# Patient Record
Sex: Female | Born: 1985 | Race: Black or African American | Hispanic: No | Marital: Married | Smoking: Never smoker
Health system: Southern US, Community
[De-identification: ages and names within clinical notes are randomized; demographics above are authoritative.]

## PROBLEM LIST (undated history)

## (undated) ENCOUNTER — Inpatient Hospital Stay (HOSPITAL_COMMUNITY): Payer: Self-pay

## (undated) DIAGNOSIS — Z789 Other specified health status: Secondary | ICD-10-CM

## (undated) HISTORY — PX: NO PAST SURGERIES: SHX2092

## (undated) HISTORY — DX: Other specified health status: Z78.9

---

## 2014-12-08 ENCOUNTER — Encounter: Payer: Self-pay | Admitting: Family Medicine

## 2014-12-25 ENCOUNTER — Encounter: Payer: Self-pay | Admitting: Family Medicine

## 2015-03-30 ENCOUNTER — Other Ambulatory Visit: Payer: Self-pay | Admitting: Student

## 2015-03-30 ENCOUNTER — Ambulatory Visit (HOSPITAL_COMMUNITY)
Admission: RE | Admit: 2015-03-30 | Discharge: 2015-03-30 | Disposition: A | Discharge status: Home / Self Care | Payer: Self-pay | Source: Ambulatory Visit | Attending: Student | Admitting: Student

## 2015-03-30 ENCOUNTER — Encounter: Payer: Self-pay | Admitting: Student

## 2015-03-30 ENCOUNTER — Ambulatory Visit (INDEPENDENT_AMBULATORY_CARE_PROVIDER_SITE_OTHER): Payer: Self-pay | Admitting: Student

## 2015-03-30 VITALS — BP 116/58 | HR 82 | Ht 66.14 in | Wt 164.3 lb

## 2015-03-30 DIAGNOSIS — Z3689 Encounter for other specified antenatal screening: Secondary | ICD-10-CM

## 2015-03-30 DIAGNOSIS — Z3A31 31 weeks gestation of pregnancy: Secondary | ICD-10-CM | POA: Insufficient documentation

## 2015-03-30 DIAGNOSIS — O3410 Maternal care for benign tumor of corpus uteri, unspecified trimester: Secondary | ICD-10-CM

## 2015-03-30 DIAGNOSIS — O3411 Maternal care for benign tumor of corpus uteri, first trimester: Secondary | ICD-10-CM

## 2015-03-30 DIAGNOSIS — D259 Leiomyoma of uterus, unspecified: Secondary | ICD-10-CM

## 2015-03-30 DIAGNOSIS — Z124 Encounter for screening for malignant neoplasm of cervix: Secondary | ICD-10-CM

## 2015-03-30 DIAGNOSIS — Z36 Encounter for antenatal screening of mother: Secondary | ICD-10-CM | POA: Insufficient documentation

## 2015-03-30 DIAGNOSIS — Z3481 Encounter for supervision of other normal pregnancy, first trimester: Secondary | ICD-10-CM

## 2015-03-30 DIAGNOSIS — Z3491 Encounter for supervision of normal pregnancy, unspecified, first trimester: Secondary | ICD-10-CM

## 2015-03-30 DIAGNOSIS — O3413 Maternal care for benign tumor of corpus uteri, third trimester: Secondary | ICD-10-CM | POA: Insufficient documentation

## 2015-03-30 DIAGNOSIS — Z113 Encounter for screening for infections with a predominantly sexual mode of transmission: Secondary | ICD-10-CM

## 2015-03-30 DIAGNOSIS — O0990 Supervision of high risk pregnancy, unspecified, unspecified trimester: Secondary | ICD-10-CM | POA: Insufficient documentation

## 2015-03-30 LAB — POCT URINALYSIS DIP (DEVICE)
Bilirubin Urine: NEGATIVE
Glucose, UA: NEGATIVE mg/dL
Ketones, ur: NEGATIVE mg/dL
Nitrite: NEGATIVE
Protein, ur: NEGATIVE mg/dL
Specific Gravity, Urine: 1.015 (ref 1.005–1.030)
Urobilinogen, UA: 0.2 mg/dL (ref 0.0–1.0)
pH: 7 (ref 5.0–8.0)

## 2015-03-30 NOTE — Progress Notes (Signed)
  Subjective:    Christine Myers is being seen today for her first obstetrical visit.  This is a planned pregnancy. She is at [redacted]w[redacted]d gestation. Her obstetrical history is significant for uterine fibroids & late transfer of care from [place]. Relationship with FOB: spouse, living together. Patient does intend to breast feed. Pregnancy history fully reviewed.  Patient reports no complaints.  Review of Systems:   Review of Systems Review of Systems - History obtained from the patient General ROS: negative Gastrointestinal ROS: negative Genito-Urinary ROS: positive for - genital discharge. Reports vaginal discharge since beginning of pregnancy. Evaluated by ob in [place] & told normal. No change.  negative for - vaginal bleeding or irritation  Objective:     BP 116/58 mmHg  Pulse 82  Ht 5' 6.14" (1.68 m)  Wt 164 lb 4.8 oz (74.526 kg)  BMI 26.41 kg/m2  LMP 08/25/2014 (Exact Date) Physical Exam  Exam Physical Examination: General appearance - alert, well appearing, and in no distress and oriented to person, place, and time Neck - supple, no significant adenopathy Heart - normal rate, regular rhythm, normal S1, S2, no murmurs, rubs, clicks or gallops Abdomen - soft, non tender. BS present. Fundal height 28 cm Pelvic - VULVA: normal appearing vulva with no masses, tenderness or lesions, VAGINA: vaginal discharge - white, creamy, odorless and moderate amount, WET MOUNT collected, CERVIX: visually closed  Musculoskeletal - full range of motion without pain Skin - normal coloration and turgor, no rashes, no suspicious skin lesions noted    Assessment:    Pregnancy: G1P0 Patient Active Problem List   Diagnosis Date Noted  . Supervision of normal pregnancy in first trimester 03/30/2015  . 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 Supervision of normal pregnancy in first trimester  - Prenatal Profile - Glucose Tolerance, 1 HR (50g) w/o Fasting -  Hemoglobinopathy evaluation - Culture, OB Urine - Prescript Monitor Profile(19) - GC/Chlamydia probe amp ([place])not at [facility] - [place] MFM OB COMP + 14 WK; Future - Cytology - PAP - Wet prep, genital - Flu Vaccine QUAD 36+ mos IM (Fluarix, Quad PF)  2. 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 Vaginal discharge  -wet prep sent    Initial labs drawn. Prenatal vitamins. Problem list reviewed and updated.    Jorje Guild 03/30/2015

## 2015-03-30 NOTE — Progress Notes (Signed)
Pt reports already have taken the tdap and flu vaccines. Pt arrived in [place] two days ago.

## 2015-03-30 NOTE — Patient Instructions (Signed)
Preterm Labor Information Preterm labor is when labor starts at less than 37 weeks of pregnancy. The normal length of a pregnancy is 39 to 41 weeks. CAUSES Often, there is no identifiable underlying cause as to why a woman goes into preterm labor. One of the most common known causes of preterm labor is infection. Infections of the uterus, cervix, vagina, amniotic sac, bladder, kidney, or even the lungs (pneumonia) can cause labor to start. Other suspected causes of preterm labor include:   Urogenital infections, such as yeast infections and bacterial vaginosis.   Uterine abnormalities (uterine shape, uterine septum, fibroids, or bleeding from the placenta).   A cervix that has been operated on (it may fail to stay closed).   Malformations in the fetus.   Multiple gestations (twins, triplets, and so on).   Breakage of the amniotic sac.  RISK FACTORS  Having a previous history of preterm labor.   Having premature rupture of membranes (PROM).   Having a placenta that covers the opening of the cervix (placenta previa).   Having a placenta that separates from the uterus (placental abruption).   Having a cervix that is too weak to hold the fetus in the uterus (incompetent cervix).   Having too much fluid in the amniotic sac (polyhydramnios).   Taking illegal drugs or smoking while pregnant.   Not gaining enough weight while pregnant.   Being younger than 34 and older than 28 years old.   Having a low socioeconomic status.   Being African American. SYMPTOMS Signs and symptoms of preterm labor include:   Menstrual-like cramps, abdominal pain, or back pain.  Uterine contractions that are regular, as frequent as six in an hour, regardless of their intensity (may be mild or painful).  Contractions that start on the top of the uterus and spread down to the lower abdomen and back.   A sense of increased pelvic pressure.   A watery or bloody mucus discharge that  comes from the vagina.  TREATMENT Depending on the length of the pregnancy and other circumstances, your health care provider may suggest bed rest. If necessary, there are medicines that can be given to stop contractions and to mature the fetal lungs. If labor happens before 34 weeks of pregnancy, a prolonged hospital stay may be recommended. Treatment depends on the condition of both you and the fetus.  WHAT SHOULD YOU DO IF YOU THINK YOU ARE IN PRETERM LABOR? Call your health care provider right away. You will need to go to the hospital to get checked immediately. HOW CAN YOU PREVENT PRETERM LABOR IN FUTURE PREGNANCIES? You should:   Stop smoking if you smoke.  Maintain healthy weight gain and avoid chemicals and drugs that are not necessary.  Be watchful for any type of infection.  Inform your health care provider if you have a known history of preterm labor.   This information is not intended to replace advice given to you by your health care provider. Make sure you discuss any questions you have with your health care provider.   Document Released: 04/29/2003 Document Revised: 10/09/2012 Document Reviewed: 03/11/2012 Elsevier Interactive Patient Education 2016 [place]. Fetal Movement Counts Patient Name: __________________________________________________ Patient Due Date: ____________________ Performing a fetal movement count is highly recommended in high-risk pregnancies, but it is good for every pregnant woman to do. Your health care provider may ask you to start counting fetal movements at 28 weeks of the pregnancy. Fetal movements often increase:  After eating a full meal.  After physical activity.  After eating or drinking something sweet or cold.  At rest. Pay attention to when you feel the baby is most active. This will help you notice a pattern of your baby's sleep and wake cycles and what factors contribute to an increase in fetal movement. It is important to  perform a fetal movement count at the same time each day when your baby is normally most active.  HOW TO COUNT FETAL MOVEMENTS  Find a quiet and comfortable area to sit or lie down on your left side. Lying on your left side provides the best blood and oxygen circulation to your baby.  Write down the day and time on a sheet of paper or in a journal.  Start counting kicks, flutters, swishes, rolls, or jabs in a 2-hour period. You should feel at least 10 movements within 2 hours.  If you do not feel 10 movements in 2 hours, wait 2-3 hours and count again. Look for a change in the pattern or not enough counts in 2 hours. SEEK MEDICAL CARE IF:  You feel less than 10 counts in 2 hours, tried twice.  There is no movement in over an hour.  The pattern is changing or taking longer each day to reach 10 counts in 2 hours.  You feel the baby is not moving as he or she usually does. Date: ____________ Movements: ____________ Start time: ____________ Christine Myers time: ____________  Date: ____________ Movements: ____________ Start time: ____________ Christine Myers time: ____________ Date: ____________ Movements: ____________ Start time: ____________ Christine Myers time: ____________ Date: ____________ Movements: ____________ Start time: ____________ Christine Myers time: ____________ Date: ____________ Movements: ____________ Start time: ____________ Christine Myers time: ____________ Date: ____________ Movements: ____________ Start time: ____________ Christine Myers time: ____________ Date: ____________ Movements: ____________ Start time: ____________ Christine Myers time: ____________ Date: ____________ Movements: ____________ Start time: ____________ Christine Myers time: ____________  Date: ____________ Movements: ____________ Start time: ____________ Christine Myers time: ____________ Date: ____________ Movements: ____________ Start time: ____________ Christine Myers time: ____________ Date: ____________ Movements: ____________ Start time: ____________ Christine Myers time:  ____________ Date: ____________ Movements: ____________ Start time: ____________ Christine Myers time: ____________ Date: ____________ Movements: ____________ Start time: ____________ Christine Myers time: ____________ Date: ____________ Movements: ____________ Start time: ____________ Christine Myers time: ____________ Date: ____________ Movements: ____________ Start time: ____________ Christine Myers time: ____________  Date: ____________ Movements: ____________ Start time: ____________ Christine Myers time: ____________ Date: ____________ Movements: ____________ Start time: ____________ Christine Myers time: ____________ Date: ____________ Movements: ____________ Start time: ____________ Christine Myers time: ____________ Date: ____________ Movements: ____________ Start time: ____________ Christine Myers time: ____________ Date: ____________ Movements: ____________ Start time: ____________ Christine Myers time: ____________ Date: ____________ Movements: ____________ Start time: ____________ Christine Myers time: ____________ Date: ____________ Movements: ____________ Start time: ____________ Christine Myers time: ____________  Date: ____________ Movements: ____________ Start time: ____________ Christine Myers time: ____________ Date: ____________ Movements: ____________ Start time: ____________ Christine Myers time: ____________ Date: ____________ Movements: ____________ Start time: ____________ Christine Myers time: ____________ Date: ____________ Movements: ____________ Start time: ____________ Christine Myers time: ____________ Date: ____________ Movements: ____________ Start time: ____________ Christine Myers time: ____________ Date: ____________ Movements: ____________ Start time: ____________ Christine Myers time: ____________ Date: ____________ Movements: ____________ Start time: ____________ Christine Myers time: ____________  Date: ____________ Movements: ____________ Start time: ____________ Christine Myers time: ____________ Date: ____________ Movements: ____________ Start time: ____________ Christine Myers time: ____________ Date: ____________ Movements:  ____________ Start time: ____________ Christine Myers time: ____________ Date: ____________ Movements: ____________ Start time: ____________ Christine Myers time: ____________ Date: ____________ Movements: ____________ Start time: ____________ Christine Myers time: ____________ Date: ____________ Movements: ____________ Start time: ____________ Christine Myers time: ____________ Date: ____________ Movements: ____________ Start time: ____________  Finish time: ____________  Date: ____________ Movements: ____________ Start time: ____________ Christine Myers time: ____________ Date: ____________ Movements: ____________ Start time: ____________ Christine Myers time: ____________ Date: ____________ Movements: ____________ Start time: ____________ Christine Myers time: ____________ Date: ____________ Movements: ____________ Start time: ____________ Christine Myers time: ____________ Date: ____________ Movements: ____________ Start time: ____________ Christine Myers time: ____________ Date: ____________ Movements: ____________ Start time: ____________ Christine Myers time: ____________ Date: ____________ Movements: ____________ Start time: ____________ Christine Myers time: ____________  Date: ____________ Movements: ____________ Start time: ____________ Christine Myers time: ____________ Date: ____________ Movements: ____________ Start time: ____________ Christine Myers time: ____________ Date: ____________ Movements: ____________ Start time: ____________ Christine Myers time: ____________ Date: ____________ Movements: ____________ Start time: ____________ Christine Myers time: ____________ Date: ____________ Movements: ____________ Start time: ____________ Christine Myers time: ____________ Date: ____________ Movements: ____________ Start time: ____________ Christine Myers time: ____________ Date: ____________ Movements: ____________ Start time: ____________ Christine Myers time: ____________  Date: ____________ Movements: ____________ Start time: ____________ Christine Myers time: ____________ Date: ____________ Movements: ____________ Start time: ____________ Christine Myers  time: ____________ Date: ____________ Movements: ____________ Start time: ____________ Christine Myers time: ____________ Date: ____________ Movements: ____________ Start time: ____________ Christine Myers time: ____________ Date: ____________ Movements: ____________ Start time: ____________ Christine Myers time: ____________ Date: ____________ Movements: ____________ Start time: ____________ Christine Myers time: ____________   This information is not intended to replace advice given to you by your health care provider. Make sure you discuss any questions you have with your health care provider.   Document Released: 03/08/2006 Document Revised: 02/27/2014 Document Reviewed: 12/04/2011 Elsevier Interactive Patient Education [employer].

## 2015-03-31 LAB — GC/CHLAMYDIA PROBE AMP (~~LOC~~) NOT AT ARMC
Chlamydia: NEGATIVE
NEISSERIA GONORRHEA: NEGATIVE

## 2015-03-31 LAB — PRENATAL PROFILE (SOLSTAS)
ANTIBODY SCREEN: NEGATIVE
BASOS PCT: 0 % (ref 0–1)
Basophils Absolute: 0 10*3/uL (ref 0.0–0.1)
EOS ABS: 0.1 10*3/uL (ref 0.0–0.7)
Eosinophils Relative: 1 % (ref 0–5)
HCT: 33.7 % — ABNORMAL LOW (ref 36.0–46.0)
HEMOGLOBIN: 11 g/dL — AB (ref 12.0–15.0)
HIV 1&2 Ab, 4th Generation: NONREACTIVE
Hepatitis B Surface Ag: NEGATIVE
LYMPHS ABS: 1.6 10*3/uL (ref 0.7–4.0)
Lymphocytes Relative: 26 % (ref 12–46)
MCH: 26.4 pg (ref 26.0–34.0)
MCHC: 32.6 g/dL (ref 30.0–36.0)
MCV: 80.8 fL (ref 78.0–100.0)
MONO ABS: 0.5 10*3/uL (ref 0.1–1.0)
MONOS PCT: 8 % (ref 3–12)
Neutro Abs: 4.1 10*3/uL (ref 1.7–7.7)
Neutrophils Relative %: 65 % (ref 43–77)
PLATELETS: 139 10*3/uL — AB (ref 150–400)
RBC: 4.17 MIL/uL (ref 3.87–5.11)
RDW: 16.8 % — ABNORMAL HIGH (ref 11.5–15.5)
RH TYPE: POSITIVE
RUBELLA: 3.48 {index} — AB (ref <0.90)
WBC: 6.3 10*3/uL (ref 4.0–10.5)

## 2015-03-31 LAB — WET PREP, GENITAL
Clue Cells Wet Prep HPF POC: NONE SEEN
TRICH WET PREP: NONE SEEN

## 2015-03-31 LAB — GLUCOSE TOLERANCE, 1 HOUR (50G) W/O FASTING: GLUCOSE 1 HOUR GTT: 102 mg/dL (ref 70–140)

## 2015-03-31 LAB — CYTOLOGY - PAP

## 2015-04-01 LAB — PRESCRIPTION MONITORING PROFILE (19 PANEL)
Amphetamine/Meth: NEGATIVE ng/mL
Barbiturate Screen, Urine: NEGATIVE ng/mL
Benzodiazepine Screen, Urine: NEGATIVE ng/mL
Buprenorphine, Urine: NEGATIVE ng/mL
CARISOPRODOL, URINE: NEGATIVE ng/mL
COCAINE METABOLITES: NEGATIVE ng/mL
Cannabinoid Scrn, Ur: NEGATIVE ng/mL
Creatinine, Urine: 44.95 mg/dL (ref >20.0)
ECSTASY: NEGATIVE ng/mL
FENTANYL URINE: NEGATIVE ng/mL
MEPERIDINE UR: NEGATIVE ng/mL
METHADONE SCREEN, URINE: NEGATIVE ng/mL
Methaqualone: NEGATIVE ng/mL
NITRITES URINE, INITIAL: NEGATIVE ug/mL
OXYCODONE SCRN UR: NEGATIVE ng/mL
Opiate Screen, Urine: NEGATIVE ng/mL
PROPOXYPHENE: NEGATIVE ng/mL
Phencyclidine, Ur: NEGATIVE ng/mL
TRAMADOL UR: NEGATIVE ng/mL
Tapentadol, urine: NEGATIVE ng/mL
Zolpidem, Urine: NEGATIVE ng/mL
pH, Initial: 7.9 pH (ref 4.5–8.9)

## 2015-04-01 LAB — HEMOGLOBINOPATHY EVALUATION
HGB A2 QUANT: 3.1 % (ref 2.2–3.2)
HGB A: 64.4 % — AB (ref 96.8–97.8)
HGB F QUANT: 0 % (ref 0.0–2.0)
HGB S QUANTITAION: 32.5 % — AB
Hemoglobin Other: 0 %

## 2015-04-01 LAB — CULTURE, OB URINE: Colony Count: 30000

## 2015-04-02 ENCOUNTER — Telehealth: Payer: Self-pay | Admitting: *Deleted

## 2015-04-02 ENCOUNTER — Other Ambulatory Visit: Payer: Self-pay | Admitting: Student

## 2015-04-02 DIAGNOSIS — O4103X1 Oligohydramnios, third trimester, fetus 1: Secondary | ICD-10-CM

## 2015-04-02 NOTE — Telephone Encounter (Signed)
-----   Message from Jorje Guild, NP sent at 04/02/2015 10:32 AM EST ----- Please call patient: 1. Verify pharmacy. Has yeast infection & can't send in rx d/t no pharmacy listed. Order terazol 3 for patient.  2. Pt has sickle cell trait. Please verify if husband knows status, if not, refer him to be tested.  3. Please schedule 2 f/u ultrasounds. Needs f/u in 1 week to assess AFI. Needs f/u in 4 weeks for growth & to reassess myomas.   Thanks!

## 2015-04-02 NOTE — Telephone Encounter (Signed)
Attempted to call patient to verify pharmacy and give test results/referral. Patient did not answer, voice mail left stating she should return our call at the clinic on Monday.

## 2015-04-06 NOTE — Telephone Encounter (Signed)
Called pt @ # listed and this tel # belongs to her friend. He answered and stated that he is at work. He will give pt a message to call us back.  Pt needs to be informed of several things: Ultrasound needed this week to re-check AFI - scheduled 2/17 @ 1300.  Pt also needs ultrasound for growth on 3/7 @ 0830. Pt needs Rx sent to pharmacy for +vaginal yeast - verify pharmacy. Inform pt of +sickle cell trait and verify if husband knows his sickle cell status - if not, needs referral for testing.

## 2015-04-07 NOTE — Telephone Encounter (Signed)
Attempted to reach patient at number listed in chart. No answer or voicemail to leave a message.

## 2015-04-09 ENCOUNTER — Ambulatory Visit (HOSPITAL_COMMUNITY): Admission: RE | Admit: 2015-04-09 | Payer: Self-pay | Source: Ambulatory Visit

## 2015-04-13 ENCOUNTER — Other Ambulatory Visit: Payer: Self-pay

## 2015-04-13 ENCOUNTER — Ambulatory Visit (INDEPENDENT_AMBULATORY_CARE_PROVIDER_SITE_OTHER): Payer: Self-pay | Admitting: Family

## 2015-04-13 VITALS — BP 127/77 | HR 66 | Temp 98.2°F | Wt 165.7 lb

## 2015-04-13 DIAGNOSIS — Z3481 Encounter for supervision of other normal pregnancy, first trimester: Secondary | ICD-10-CM

## 2015-04-13 DIAGNOSIS — D259 Leiomyoma of uterus, unspecified: Secondary | ICD-10-CM

## 2015-04-13 DIAGNOSIS — O289 Unspecified abnormal findings on antenatal screening of mother: Secondary | ICD-10-CM

## 2015-04-13 DIAGNOSIS — Z3491 Encounter for supervision of normal pregnancy, unspecified, first trimester: Secondary | ICD-10-CM

## 2015-04-13 DIAGNOSIS — O3410 Maternal care for benign tumor of corpus uteri, unspecified trimester: Secondary | ICD-10-CM

## 2015-04-13 DIAGNOSIS — Z23 Encounter for immunization: Secondary | ICD-10-CM

## 2015-04-13 DIAGNOSIS — O288 Other abnormal findings on antenatal screening of mother: Secondary | ICD-10-CM

## 2015-04-13 DIAGNOSIS — O3411 Maternal care for benign tumor of corpus uteri, first trimester: Secondary | ICD-10-CM

## 2015-04-13 LAB — POCT URINALYSIS DIP (DEVICE)
BILIRUBIN URINE: NEGATIVE
Glucose, UA: NEGATIVE mg/dL
HGB URINE DIPSTICK: NEGATIVE
Ketones, ur: NEGATIVE mg/dL
NITRITE: NEGATIVE
PH: 5 (ref 5.0–8.0)
Protein, ur: NEGATIVE mg/dL
Specific Gravity, Urine: 1.015 (ref 1.005–1.030)
Urobilinogen, UA: 0.2 mg/dL (ref 0.0–1.0)

## 2015-04-13 MED ORDER — PRENATAL VITAMINS 28-0.8 MG PO TABS: 1.0000 | ORAL_TABLET | Freq: Every morning | ORAL | Status: AC

## 2015-04-13 NOTE — Telephone Encounter (Signed)
Patient in office today, AFI scheduled for 2/23 @ 2pm, patient is aware. Janann Colonel, CNM discussed +sickle cell trait with patient.

## 2015-04-13 NOTE — Progress Notes (Signed)
Pt states she is taking "blood tonic" capsules. Flu vac given today.

## 2015-04-13 NOTE — Progress Notes (Signed)
Subjective:  [place] is a 30 y.o. G1P0 at [redacted]w[redacted]d being seen today for ongoing prenatal care.  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  Patient reports vaginal irritation and white discharge.  Contractions: Not present. Vag. Bleeding: None.  Movement: Present. Denies leaking of fluid.   The following portions of the patient's history were reviewed and updated as appropriate: allergies, current medications, past family history, past medical history, past social history, past surgical history and problem list. Problem list updated.  Objective:   Filed Vitals:   04/13/15 1017  BP: 127/77  Pulse: 66  Temp: 98.2 F (36.8 C)  Weight: 165 lb 11.2 oz (75.161 kg)    Fetal Status: Fetal Heart Rate (bpm): 129 Fundal Height: 36 cm Movement: Present     General:  Alert, oriented and cooperative. Patient is in no acute distress.  Skin: Skin is warm and dry. No rash noted.   Cardiovascular: Normal heart rate noted  Respiratory: Normal respiratory effort, no problems with respiration noted  Abdomen: Soft, gravid, appropriate for gestational age. Pain/Pressure: Absent     Pelvic: Vag. Bleeding: None     Cervical exam deferred        Extremities: Normal range of motion.  Edema: None  Mental Status: Normal mood and affect. Normal behavior. Normal judgment and thought content.   Urinalysis: Urine Protein: Negative Urine Glucose: Negative  Assessment and Plan:  Pregnancy: G1P0 at [redacted]w[redacted]d  1. Supervision of normal pregnancy in first trimester - Tdap today  2. AFI (amniotic fluid index) borderline low - [place] MFM OB LIMITED; Future; appt 04/15/15  3. 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  Vaginal Discharge  -  Currently has miconazole that she plans to use  Preterm labor symptoms and general obstetric precautions including but not limited to vaginal bleeding, contractions, leaking of fluid and fetal movement were reviewed in detail with the patient. Please refer to After Visit Summary for other counseling recommendations.  Return in about 2 weeks (around 04/27/2015).   Venia Carbon Michiel Cowboy, CNM

## 2015-04-13 NOTE — Patient Instructions (Signed)
[facility] [place], [place] [address] Testing on Thursdays, Call to make an appointment [phone] Toll Free: [phone]    AREA PEDIATRIC/FAMILY PRACTICE PHYSICIANS  [facility] [place] [address] [place], [place] [address] Phone - [phone]   Fax - [place] [address] [place], [place]  [address] Phone - [phone]   Fax - [phone]  [place] [address] [place] [address] [place], [place]  [address] Phone - [phone]   Fax - [phone]  [facility] [address] [place], [place]  [address] Phone - [phone]   Fax - [phone]  [place] [address], [place], [place]  [address] Phone - [phone]   Fax - [place] [address], [id number] [place], [place]  [address] Phone - [phone]   Fax - [place] [facility] [place] [address], [place], [place]  [address] Phone - [phone]   Fax - [phone]  [place] [address], [place], [place]  [address] Phone - [phone]   Fax - [place] [address] [place], [place]  [address] Phone - [phone]   Fax - [phone] [facility] [place]. [address] [place], [place]  [address] Phone - [phone]   Fax - [phone]  [place] [address] [place], [place]  [address] Phone - [phone]   Fax - [phone]  [facility] [place], [place], [place]  [address] Phone - [phone]   Fax - [place] [address] [place], [place]  [address] Phone - [phone]   Fax - [phone]  [facility] [address], [place], [place]   [address] Phone - [place], [place]  [address] Phone - [phone]   Fax - [place] [address] [place], [place]  [address] Phone - [phone]   Fax - [phone]  [place] [address] [place], [place]  [address] Phone - [phone]   Fax - [place]. [place], [place]  [address] Phone - [phone]   Fax - [place], [place], [place]  [address] Phone - [phone]   Fax - [place] [address] [place], [place]  [address] Phone - [phone]   Fax - [phone]  [place] [place] [address], [place], [place]  [address] Phone - [phone]   Fax - [place] [address] [place], [place]  [address] Phone - [phone]   Fax - [phone]  [place] [address] [place], [place]  [address] Phone - [phone]   Fax - [phone] [place] [id number] [place], [place]  [address] Phone - [phone]   Fax - [place] [address] [place], [place]  [address] Phone - [phone]   Fax - [place] [address], [place], [place]  [address] Phone - [phone]   Fax - [phone]

## 2015-04-15 ENCOUNTER — Ambulatory Visit (HOSPITAL_COMMUNITY)
Admission: RE | Admit: 2015-04-15 | Discharge: 2015-04-15 | Disposition: A | Discharge status: Home / Self Care | Payer: Self-pay | Source: Ambulatory Visit | Attending: Family | Admitting: Family

## 2015-04-15 ENCOUNTER — Other Ambulatory Visit: Payer: Self-pay | Admitting: Family

## 2015-04-15 DIAGNOSIS — O4440 Low lying placenta NOS or without hemorrhage, unspecified trimester: Secondary | ICD-10-CM

## 2015-04-15 DIAGNOSIS — O4100X0 Oligohydramnios, unspecified trimester, not applicable or unspecified: Secondary | ICD-10-CM | POA: Insufficient documentation

## 2015-04-15 DIAGNOSIS — Z3A33 33 weeks gestation of pregnancy: Secondary | ICD-10-CM | POA: Insufficient documentation

## 2015-04-15 DIAGNOSIS — O288 Other abnormal findings on antenatal screening of mother: Secondary | ICD-10-CM

## 2015-04-15 DIAGNOSIS — O3413 Maternal care for benign tumor of corpus uteri, third trimester: Secondary | ICD-10-CM | POA: Insufficient documentation

## 2015-04-15 DIAGNOSIS — O3410 Maternal care for benign tumor of corpus uteri, unspecified trimester: Secondary | ICD-10-CM

## 2015-04-15 DIAGNOSIS — D259 Leiomyoma of uterus, unspecified: Secondary | ICD-10-CM

## 2015-04-27 ENCOUNTER — Ambulatory Visit (HOSPITAL_COMMUNITY)
Admission: RE | Admit: 2015-04-27 | Discharge: 2015-04-27 | Disposition: A | Discharge status: Home / Self Care | Payer: Self-pay | Source: Ambulatory Visit | Attending: Student | Admitting: Student

## 2015-04-27 ENCOUNTER — Ambulatory Visit (HOSPITAL_COMMUNITY): Payer: Self-pay

## 2015-04-27 ENCOUNTER — Other Ambulatory Visit: Payer: Self-pay | Admitting: Student

## 2015-04-27 ENCOUNTER — Ambulatory Visit (INDEPENDENT_AMBULATORY_CARE_PROVIDER_SITE_OTHER): Payer: Self-pay | Admitting: Family

## 2015-04-27 VITALS — BP 124/74 | HR 88 | Temp 98.5°F | Wt 170.6 lb

## 2015-04-27 DIAGNOSIS — Z3A35 35 weeks gestation of pregnancy: Secondary | ICD-10-CM

## 2015-04-27 DIAGNOSIS — D259 Leiomyoma of uterus, unspecified: Secondary | ICD-10-CM | POA: Insufficient documentation

## 2015-04-27 DIAGNOSIS — O0993 Supervision of high risk pregnancy, unspecified, third trimester: Secondary | ICD-10-CM

## 2015-04-27 DIAGNOSIS — O4443 Low lying placenta NOS or without hemorrhage, third trimester: Secondary | ICD-10-CM

## 2015-04-27 DIAGNOSIS — O4103X1 Oligohydramnios, third trimester, fetus 1: Secondary | ICD-10-CM

## 2015-04-27 DIAGNOSIS — O3413 Maternal care for benign tumor of corpus uteri, third trimester: Secondary | ICD-10-CM

## 2015-04-27 DIAGNOSIS — O4440 Low lying placenta NOS or without hemorrhage, unspecified trimester: Secondary | ICD-10-CM

## 2015-04-27 DIAGNOSIS — Z36 Encounter for antenatal screening of mother: Secondary | ICD-10-CM | POA: Insufficient documentation

## 2015-04-27 DIAGNOSIS — O3410 Maternal care for benign tumor of corpus uteri, unspecified trimester: Secondary | ICD-10-CM

## 2015-04-27 DIAGNOSIS — O288 Other abnormal findings on antenatal screening of mother: Secondary | ICD-10-CM

## 2015-04-27 DIAGNOSIS — O3412 Maternal care for benign tumor of corpus uteri, second trimester: Secondary | ICD-10-CM | POA: Insufficient documentation

## 2015-04-27 LAB — POCT URINALYSIS DIP (DEVICE)
Bilirubin Urine: NEGATIVE
Glucose, UA: NEGATIVE mg/dL
Hgb urine dipstick: NEGATIVE
Ketones, ur: NEGATIVE mg/dL
Nitrite: NEGATIVE
Protein, ur: NEGATIVE mg/dL
Specific Gravity, Urine: 1.015 (ref 1.005–1.030)
Urobilinogen, UA: 0.2 mg/dL (ref 0.0–1.0)
pH: 5.5 (ref 5.0–8.0)

## 2015-04-27 IMAGING — US US MFM OB FOLLOW-UP
1 series · 13 of 28 positions shown · non-contrast
Comparison: none

[Series 1: us mfm ob follow-up · 13 of 42 slices shown]
[im 2/42]
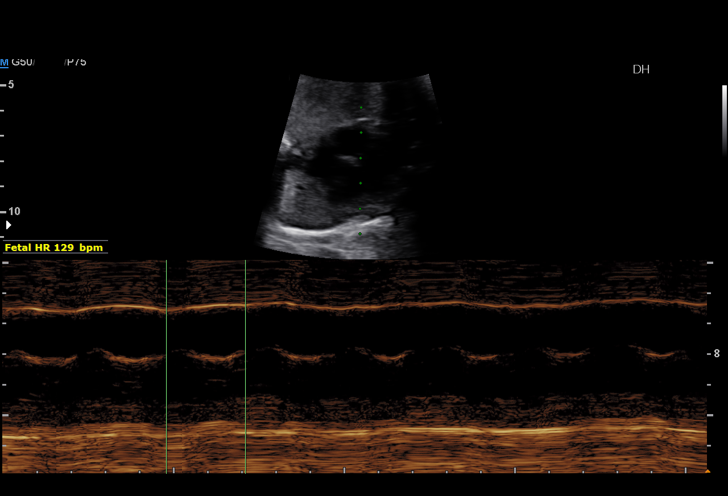
[im 5/42]
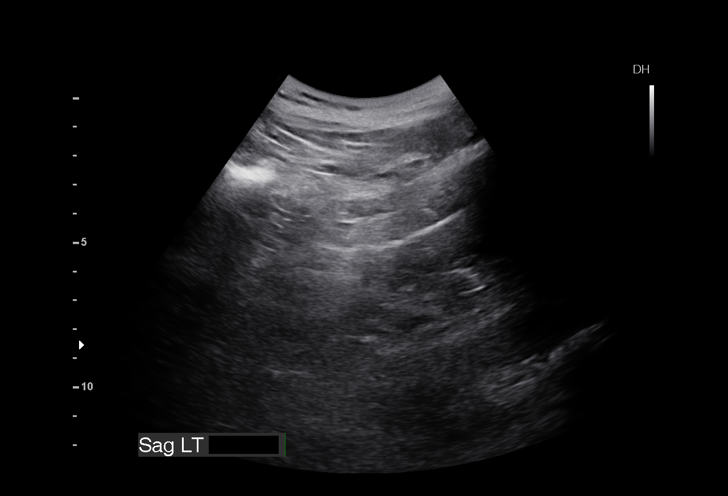
[im 8/42]
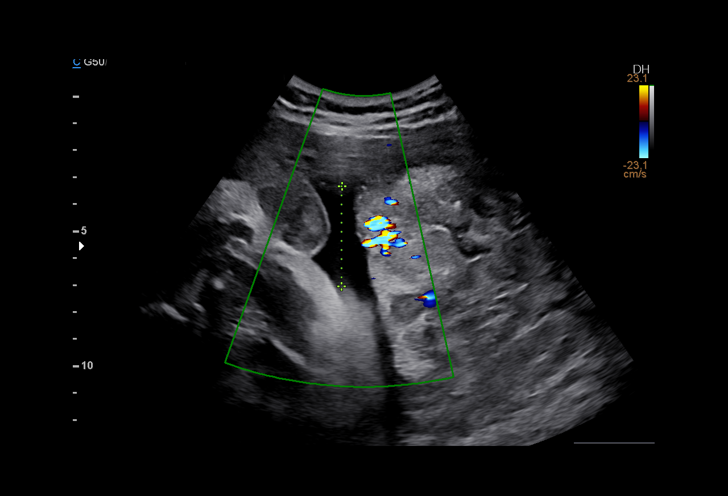
[im 11/42]
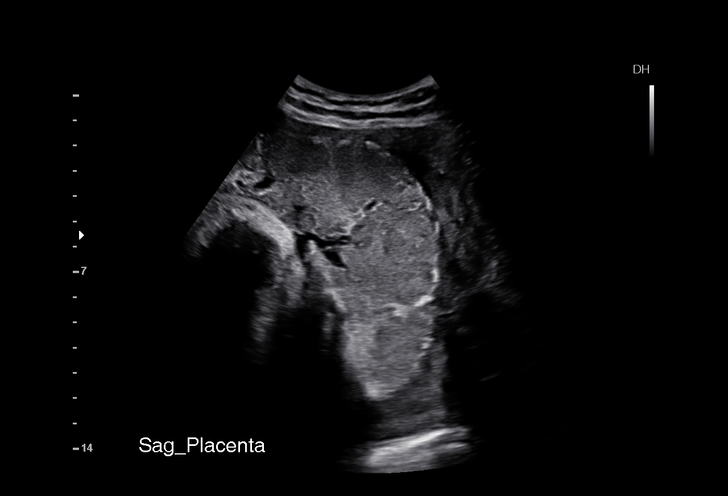
[im 14/42]
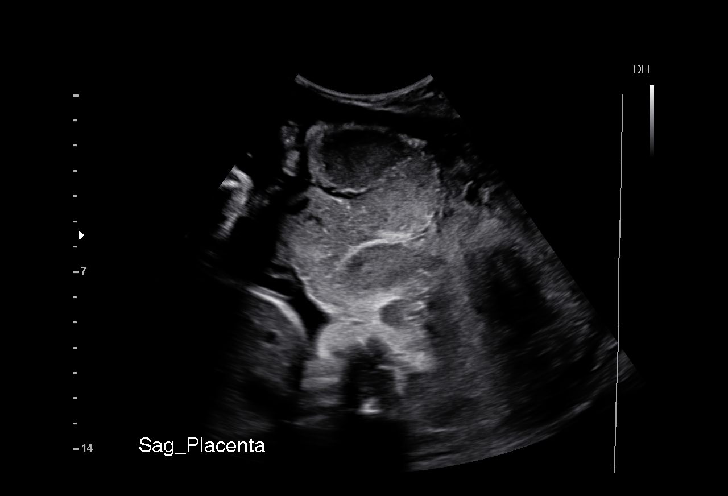
[im 17/42]
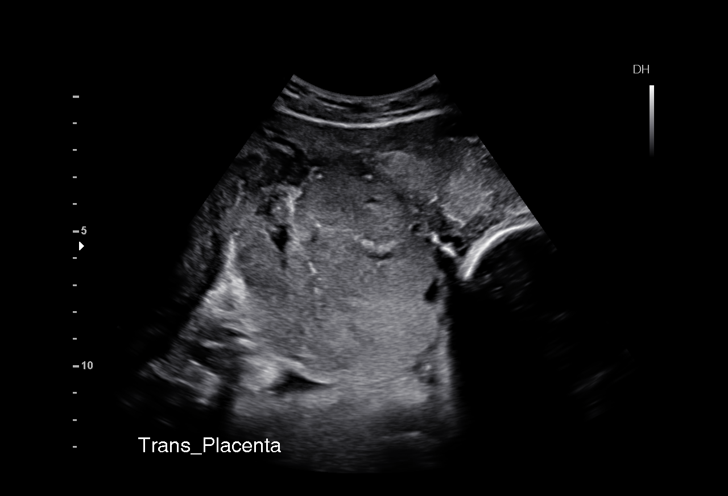
[im 22/42]
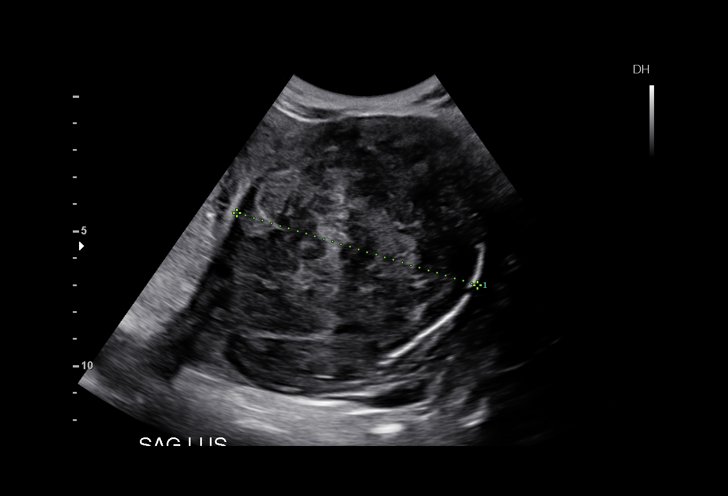
[im 25/42]
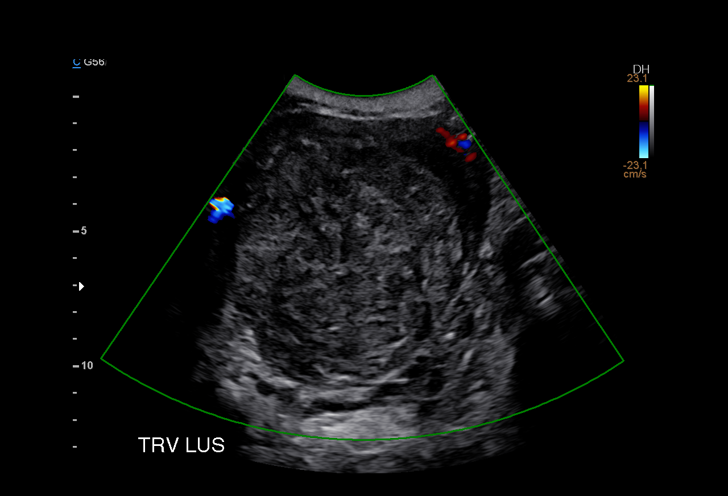
[im 28/42]
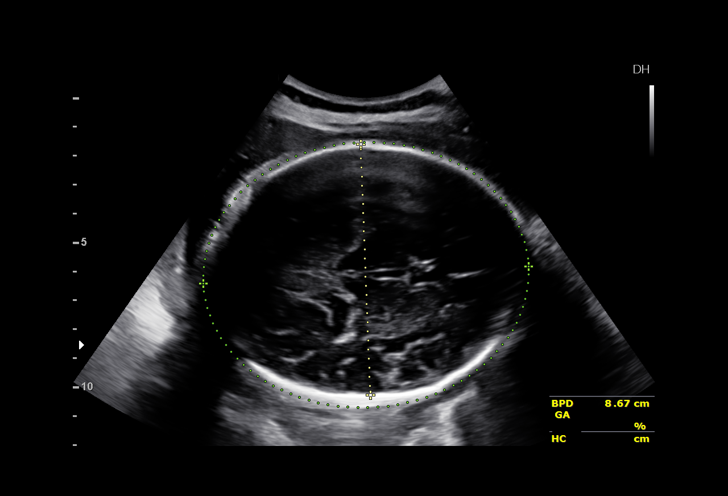
[im 31/42]
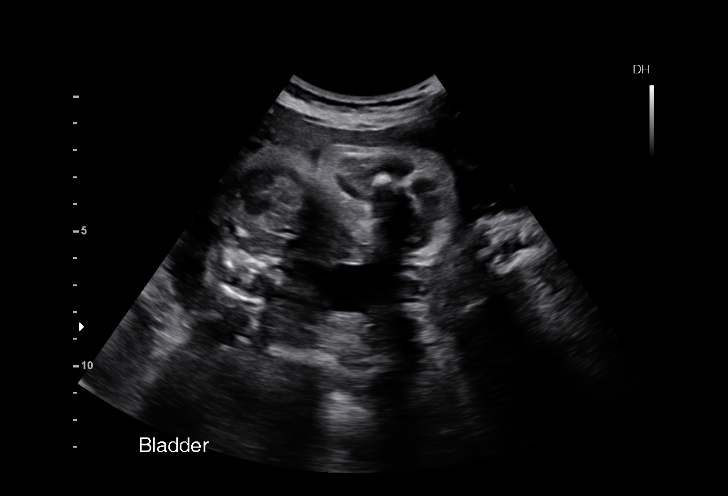
[im 34/42]
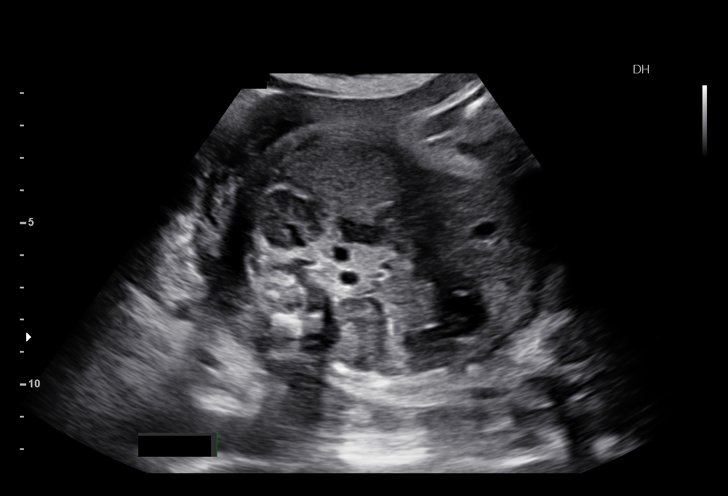
[im 37/42]
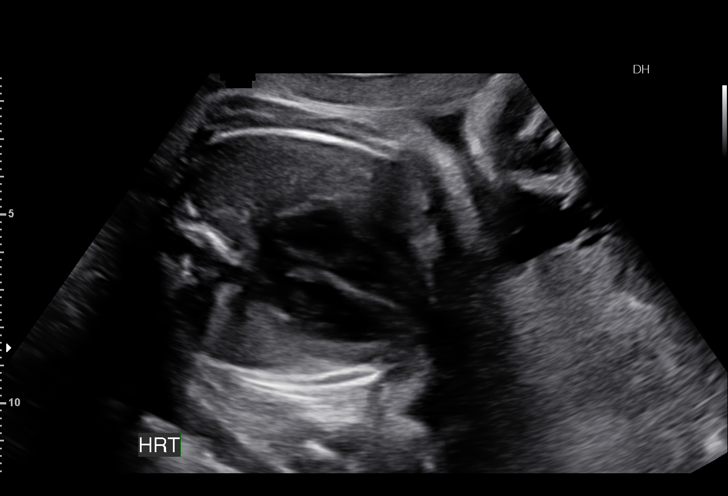
[im 40/42]
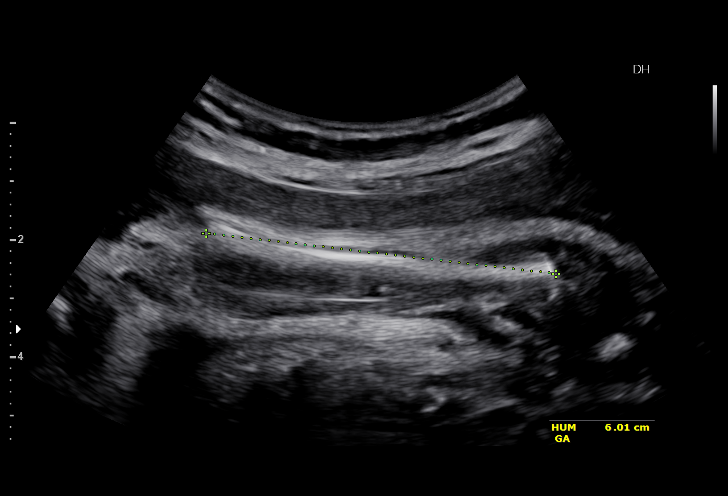

[13 of 28 positions shown; findings below may reference images not displayed]

Hospital Clinic-
Faculty Physician
OB/Gyn Clinic

Indications

35 weeks gestation of pregnancy
Uterine fibroids affecting pregnancy in third  O34.13,
trimester, antepartum
Low lying placenta, antepartum ??
Follow-up incomplete fetal anatomic            Z36
evaluation

OB History

Gravidity:    1         Term:   0        Prem:   0        SAB:   0
TOP:          0       Ectopic:  0        Living: 0
Fetal Evaluation

Num Of Fetuses:     1
Fetal Heart         129
Rate(bpm):
Cardiac Activity:   Observed
Presentation:       Transverse, head to maternal left

Amniotic Fluid
AFI FV:      Subjectively within normal limits
AFI Sum:     12.88   cm      42   %Tile     Larg Pckt:   4.76   cm
RUQ:   4.76   cm    RLQ:    1.92   cm    LUQ:   2.49    cm   LLQ:    3.71   cm
Biometry

BPD:      86.7  mm     G. Age:  35w 0d                  CI:        74.31   %   70 - 86
FL/HC:      20.1   %   20.1 -
HC:      319.3  mm     G. Age:  36w 0d         40  %    HC/AC:      1.07       0.93 -
AC:      299.3  mm     G. Age:  34w 0d         25  %    FL/BPD:     74.0   %   71 - 87
FL:       64.2  mm     G. Age:  33w 1d          7  %    FL/AC:      21.5   %   20 - 24
HUM:        60  mm     G. Age:  34w 6d         61  %

Est. FW:    4247  gm      5 lb 2 oz     41  %
Gestational Age

LMP:           35w 0d       Date:   08/25/14                 EDD:   06/01/15
Clinical EDD:  34w 5d                                        EDD:   06/03/15
U/S Today:     34w 4d                                        EDD:   06/04/15
Best:          35w 0d    Det. By:   LMP  (08/25/14)          EDD:   06/01/15
Anatomy

Cranium:          Appears normal         Aortic Arch:      Previously seen
Fetal Cavum:      Appears normal         Ductal Arch:      Previously seen
Ventricles:       Appears normal         Diaphragm:        Previously seen
Choroid Plexus:   Previously seen        Stomach:          Appears normal, left
sided
Cerebellum:       Previously seen        Abdomen:          Appears normal
Posterior Fossa:  Previously seen        Abdominal Wall:   Previously seen
Nuchal Fold:      Not applicable (>20    Cord Vessels:     Previously seen
wks GA)
Face:             Orbits and profile     Kidneys:          Appear normal
previously seen
Lips:             Previously seen        Bladder:          Appears normal
Fetal Thoracic:   Appears normal         Spine:            Not well visualized
Heart:            Appears normal         Upper             Previously seen
(4CH, axis, and        Extremities:
situs)
RVOT:             Previously seen        Lower             Previously seen
Extremities:
LVOT:             Previously seen

Other:  Nasal bone previously visualized. Heels and 5th digit previously seen.
Technically difficult due to advanced GA and fetal position.
Cervix Uterus Adnexa

Cervix
Not visualized (advanced GA >92wks)

Adnexa:       No abnormality visualized.
Myomas

Site                     L(cm)      W(cm)      D(cm)      Location
Right
LUS

Blood Flow                 RI        PI       Comments
Impression

SIUP at 35+0 weeks
Transverse, back down, head to materal left; fetus is in the
upper uterus; lower uterus is "filled" with the large anterior
LUS fibroid and anterior placenta (all in mid to low uterus);
anterior fibroid deviates the cervix posteriorly
Normal interval anatomy; anatomic survey complete except
for spine
Normal amniotic fluid volume
Appropriate interval growth with EFW at the 41st %tile
Anterior placenta in mid to lower uterus, globular; unable to
visualize cervix transabdominally but does not appear to be a
previa (pt declined transvaginal exam today)

Dr. Tawnya present for ultrasound to evaluate pt regarding mode
of delivery, site of C/S incisioin, etc..
Recommendations

If desired, we can check fetal position prior to C/S

## 2015-04-27 NOTE — Progress Notes (Signed)
Educated pt on Skin to Skin  

## 2015-04-27 NOTE — Progress Notes (Signed)
Subjective:  Christine Myers is a 30 y.o. G1P0 at [redacted]w[redacted]d being seen today for ongoing prenatal care.  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  Patient reports no complaints and no report of bleeding. Pt reports that that Christine Myers did not go to ultrasound appt due to concern for increased cost.   Contractions: Not present. Vag. Bleeding: None.  Movement: Present. Denies leaking of fluid.   The following portions of the patient's history were reviewed and updated as appropriate: allergies, current medications, past family history, past medical history, past social history, past surgical history and problem list. Problem list updated.  Objective:   Filed Vitals:   04/27/15 0922  BP: 124/74  Pulse: 88  Temp: 98.5 F (36.9 C)  Weight: 170 lb 9.6 oz (77.384 kg)    Fetal Status: Fetal Heart Rate (bpm): 128 Fundal Height: 39 cm Movement: Present     General:  Alert, oriented and cooperative. Patient is in no acute distress.  Skin: Skin is warm and dry. No rash noted.   Cardiovascular: Normal heart rate noted  Respiratory: Normal respiratory effort, no problems with respiration noted  Abdomen: Soft, gravid, appropriate for gestational age. Pain/Pressure: Absent     Pelvic: Vag. Bleeding: None Vag D/C Character: White   Cervical exam deferred        Extremities: Normal range of motion.  Edema: None  Mental Status: Normal mood and affect. Normal behavior. Normal judgment and thought content.   Urinalysis:      Assessment and Plan:  Pregnancy: G1P0 at [redacted]w[redacted]d  1. AFI (amniotic fluid index) borderline low - Need to reschedule ultrasound; today at 2 pm  2. Supervision of high risk pregnancy, antepartum, third trimester - Explained the importance of close  surveillance due to low normal AFI and possible implications to fetus  3. 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 Kennon Rounds > route chart to review images with MFM  4. Low lying placenta, antepartum - need to reschedule [place] for reassessment  Preterm labor symptoms and general obstetric precautions including but not limited to vaginal bleeding, contractions, leaking of fluid and fetal movement were reviewed in detail with the patient. Please refer to After Visit Summary for other counseling recommendations.  Return in about 1 week (around 05/04/2015).   Venia Carbon Michiel Cowboy, CNM

## 2015-05-05 ENCOUNTER — Ambulatory Visit (INDEPENDENT_AMBULATORY_CARE_PROVIDER_SITE_OTHER): Payer: Self-pay | Admitting: Family Medicine

## 2015-05-05 VITALS — BP 130/68 | HR 83 | Temp 98.3°F | Wt 171.2 lb

## 2015-05-05 DIAGNOSIS — O3411 Maternal care for benign tumor of corpus uteri, first trimester: Secondary | ICD-10-CM

## 2015-05-05 DIAGNOSIS — O4443 Low lying placenta NOS or without hemorrhage, third trimester: Secondary | ICD-10-CM

## 2015-05-05 DIAGNOSIS — O288 Other abnormal findings on antenatal screening of mother: Secondary | ICD-10-CM

## 2015-05-05 DIAGNOSIS — O0993 Supervision of high risk pregnancy, unspecified, third trimester: Secondary | ICD-10-CM

## 2015-05-05 DIAGNOSIS — O289 Unspecified abnormal findings on antenatal screening of mother: Secondary | ICD-10-CM

## 2015-05-05 DIAGNOSIS — Z113 Encounter for screening for infections with a predominantly sexual mode of transmission: Secondary | ICD-10-CM

## 2015-05-05 DIAGNOSIS — O4440 Low lying placenta NOS or without hemorrhage, unspecified trimester: Secondary | ICD-10-CM

## 2015-05-05 DIAGNOSIS — O3410 Maternal care for benign tumor of corpus uteri, unspecified trimester: Secondary | ICD-10-CM

## 2015-05-05 DIAGNOSIS — D259 Leiomyoma of uterus, unspecified: Secondary | ICD-10-CM

## 2015-05-05 LAB — POCT URINALYSIS DIP (DEVICE)
Bilirubin Urine: NEGATIVE
GLUCOSE, UA: NEGATIVE mg/dL
HGB URINE DIPSTICK: NEGATIVE
Ketones, ur: NEGATIVE mg/dL
Nitrite: NEGATIVE
PROTEIN: NEGATIVE mg/dL
SPECIFIC GRAVITY, URINE: 1.02 (ref 1.005–1.030)
UROBILINOGEN UA: 0.2 mg/dL (ref 0.0–1.0)
pH: 5.5 (ref 5.0–8.0)

## 2015-05-05 NOTE — Patient Instructions (Signed)
Cesarean Delivery Cesarean delivery is the birth of a baby through a cut (incision) in the abdomen and womb (uterus).  LET YOUR HEALTH CARE PROVIDER KNOW ABOUT:  All medicines you are taking, including vitamins, herbs, eye drops, creams, and over-the-counter medicines.  Previous problems you or members of your family have had with the use of anesthetics.  Any bleeding or blood clotting disorders you have.  Family history of blood clots or bleeding disorders.  Any history of deep vein thrombosis (DVT) or pulmonary embolism (PE).  Previous surgeries you have had.  Medical conditions you have.  Any allergies you have.  Complicationsinvolving the pregnancy. RISKS AND COMPLICATIONS  Generally, this is a safe procedure. However, as with any procedure, complications can occur. Possible complications include:  Bleeding.  Infection.  Blood clots.  Injury to surrounding organs.  Problems with anesthesia.  Injury to the baby. BEFORE THE PROCEDURE   You may be given an antacid medicine to drink. This will prevent acid contents in your stomach from going into your lungs if you vomit during the surgery.  You may be given an antibiotic medicine to prevent infection. PROCEDURE   To prevent infection of your incision:  Hair may be removed from your pubic area if it is near your incision.  The skin of your pubic area and lower abdomen will be cleaned with a germ-killing solution (antiseptic).  A tube (Foley catheter) will be placed in your bladder to drain your urine from your bladder into a bag. This keeps your bladder empty during surgery.  An IV tube will be placed in your vein.  You may be given medicine to numb the lower half of your body (regional anesthetic). If you were in labor, you may have already had an epidural in place which can be used in both labor and cesarean delivery. You may possibly be given medicine to make you sleep (general anesthetic) though this is not as  common.  Your heart rate and your baby's heart rate will be monitored.  An incision will be made in your abdomen that extends to your uterus. There are 2 basic kinds of incisions:  The horizontal (transverse) incision. Horizontal incisions are from side to side and are used for most routine cesarean deliveries.  The vertical incision. The vertical incision is from the top of the abdomen to the bottom and is less commonly used. It is often done for women who have a serious complication (extreme prematurity) or under emergency situations.  The horizontal and vertical incisions may both be used at the same time. However, this is very uncommon.  An incision is then made in your uterus to deliver the baby.  Your baby will be delivered.  Your health care provider may place the baby on your chest. It is important to keep the baby warm. Your health care provider will dry off the baby, place the baby directly on your bare skin, and cover the baby with warm, dry blankets.  Both incisions will be closed with absorbable stitches. AFTER THE PROCEDURE   If you were awake during the surgery, you will see your baby right away. If you were asleep, you will see your baby as soon as you are awake.  You may breastfeed your baby after surgery.  You may be able to get up and walk the same day as the surgery. If you need to stay in bed for a period of time, you will receive help to turn, cough, and take deep breaths after   surgery. This helps prevent lung problems such as pneumonia.  Do not get out of bed alone the first time after surgery. You will need help getting out of bed until you are able to do this by yourself.  You may be able to shower the day after your cesarean delivery. After the bandage (dressing) is taken off the incision site, a nurse will assist you to shower if you would like help.  You may be directed to take actions to help prevent blood clots in your legs. These may  include:  Walking shortly after surgery, with someone assisting you. Moving around after surgery helps to improve blood flow.  Wearing compression stockings or using different types of devices.  Taking medicines to thin your blood (anticoagulants) if you are at high risk for DVT or PE.  Save any blood clots that you pass from your vagina. If you pass a clot while on the toilet, do not flush it. Call for the nurse. Tell the nurse if you think you are bleeding too much or passing too many clots.  You will be given medicine for pain and nausea as needed. Let your health care providers know if you are hurting. You may also be given an antibiotic to prevent an infection.  Your IV tube will be taken out when you are drinking a reasonable amount of fluids. The Foley catheter is taken out when you are up and walking.  If your blood type is Rh negative and your baby's blood type is Rh positive, you will be given a shot of anti-D immune globulin. This shot prevents you from having Rh problems with a future pregnancy. You should get the shot even if you had your tubes tied (tubal ligation).  If you are allowed to take the baby for a walk, place the baby in the bassinet and push it.   This information is not intended to replace advice given to you by your health care provider. Make sure you discuss any questions you have with your health care provider.   Document Released: 02/06/2005 Document Revised: 10/28/2014 Document Reviewed: 10/04/2011 Elsevier Interactive Patient Education 2016 [employer]  

## 2015-05-06 ENCOUNTER — Encounter: Payer: Self-pay | Admitting: Family

## 2015-05-06 ENCOUNTER — Encounter (HOSPITAL_COMMUNITY): Payer: Self-pay | Admitting: *Deleted

## 2015-05-06 LAB — GC/CHLAMYDIA PROBE AMP (~~LOC~~) NOT AT ARMC
Chlamydia: NEGATIVE
Neisseria Gonorrhea: NEGATIVE

## 2015-05-06 NOTE — Progress Notes (Signed)
Subjective:  [place] is a 30 y.o. G1P0 at [redacted]w[redacted]d being seen today for ongoing prenatal care.  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  Patient reports no complaints.   . Vag. Bleeding: None.  Movement: Present. Denies leaking of fluid.   The following portions of the patient's history were reviewed and updated as appropriate: allergies, current medications, past family history, past medical history, past social history, past surgical history and problem list. Problem list updated.  Objective:   Filed Vitals:   05/05/15 1524  BP: 130/68  Pulse: 83  Temp: 98.3 F (36.8 C)  Weight: 171 lb 3.2 oz (77.656 kg)    Fetal Status:     Movement: Present     General:  Alert, oriented and cooperative. Patient is in no acute distress.  Skin: Skin is warm and dry. No rash noted.   Cardiovascular: Normal heart rate noted  Respiratory: Normal respiratory effort, no problems with respiration noted  Abdomen: Soft, gravid, appropriate for gestational age. Pain/Pressure: Absent     Pelvic: Vag. Bleeding: None     Cervical exam deferred        Extremities: Normal range of motion.  Edema: None  Mental Status: Normal mood and affect. Normal behavior. Normal judgment and thought content.   Urinalysis:      Assessment and Plan:  Pregnancy: G1P0 at [redacted]w[redacted]d  1. Supervision of high risk pregnancy, antepartum, third trimester - updated box and discussed mode of delivery at length with patient.  - Culture, beta strep (group b only) - GC/Chlamydia probe amp ([place])not at [facility] - U/A NO MICRO ([id number])  2. Low lying placenta, antepartum  3. AFI (amniotic fluid index) borderline low  4. 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 Ihor Dow Discussed CS and explained skin incision will be low transverse and uterine incision maybe classical and that if this happens it will be Cs at 37 weeks.  Reviewed with patient that she may desire fibroid removal after CS but this will not be done at the time of CS Sent message to [place] to schedule CS (4/4)  Preterm labor symptoms and general obstetric precautions including but not limited to vaginal bleeding, contractions, leaking of fluid and fetal movement were reviewed in detail with the patient. Please refer to After Visit Summary for other counseling recommendations.  Return in about 1 week (around 05/12/2015) for Routine prenatal care.   Caren Macadam, MD

## 2015-05-07 ENCOUNTER — Telehealth: Payer: Self-pay | Admitting: Family Medicine

## 2015-05-07 ENCOUNTER — Encounter: Payer: Self-pay | Admitting: Family Medicine

## 2015-05-07 DIAGNOSIS — O99820 Streptococcus B carrier state complicating pregnancy: Secondary | ICD-10-CM | POA: Insufficient documentation

## 2015-05-07 LAB — CULTURE, BETA STREP (GROUP B ONLY)

## 2015-05-07 NOTE — Telephone Encounter (Signed)
Patient called requesting return call and requesting letter. Patient needs letter from clinic due to cancelling flight, per providers recommendation to not travel. Please call patient to go over details of letter.

## 2015-05-10 ENCOUNTER — Telehealth: Payer: Self-pay | Admitting: *Deleted

## 2015-05-10 ENCOUNTER — Encounter: Payer: Self-pay | Admitting: *Deleted

## 2015-05-10 NOTE — Telephone Encounter (Signed)
Christine Myers called and left a message on voicemail that she is calling about a letter she requested Friday from hospital for airlines.   States she was told by provider that she can't take flight  So she had to cancel flight to [place]. To get refund she needs letter. Wants to know if letter is ready.  Per chart review do not see notes about letter or completed letter. Discussed with Dr. Ernestina Patches and letter prepared.  Called Christine Myers and told her letter is ready for pick up, she states she will pick it up tomorrow at her appointment.

## 2015-05-10 NOTE — Telephone Encounter (Signed)
Per message from Dr. Ernestina Patches need to tell pt. +GBS, will get antibiotics in labor. Called Katharine Look and left message we are calling with some non=urgent information we can discuss at your appt tomorrow. Put note on appt notes for tomorrow.

## 2015-05-11 ENCOUNTER — Ambulatory Visit (INDEPENDENT_AMBULATORY_CARE_PROVIDER_SITE_OTHER): Payer: Self-pay | Admitting: Obstetrics and Gynecology

## 2015-05-11 VITALS — BP 124/72 | HR 55 | Temp 98.4°F | Wt 170.5 lb

## 2015-05-11 DIAGNOSIS — O3410 Maternal care for benign tumor of corpus uteri, unspecified trimester: Secondary | ICD-10-CM

## 2015-05-11 DIAGNOSIS — O288 Other abnormal findings on antenatal screening of mother: Secondary | ICD-10-CM

## 2015-05-11 DIAGNOSIS — O289 Unspecified abnormal findings on antenatal screening of mother: Secondary | ICD-10-CM

## 2015-05-11 DIAGNOSIS — D259 Leiomyoma of uterus, unspecified: Secondary | ICD-10-CM

## 2015-05-11 DIAGNOSIS — O3412 Maternal care for benign tumor of corpus uteri, second trimester: Secondary | ICD-10-CM

## 2015-05-11 DIAGNOSIS — O4103X0 Oligohydramnios, third trimester, not applicable or unspecified: Secondary | ICD-10-CM

## 2015-05-11 DIAGNOSIS — O4103X1 Oligohydramnios, third trimester, fetus 1: Secondary | ICD-10-CM

## 2015-05-11 LAB — POCT URINALYSIS DIP (DEVICE)
Bilirubin Urine: NEGATIVE
GLUCOSE, UA: NEGATIVE mg/dL
KETONES UR: NEGATIVE mg/dL
NITRITE: NEGATIVE
PROTEIN: NEGATIVE mg/dL
Specific Gravity, Urine: 1.01 (ref 1.005–1.030)
UROBILINOGEN UA: 0.2 mg/dL (ref 0.0–1.0)
pH: 6 (ref 5.0–8.0)

## 2015-05-11 NOTE — Progress Notes (Signed)
Subjective:  Christine Myers is a 31 y.o. G1P0 at [redacted]w[redacted]d being seen today for ongoing prenatal care.  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  Patient reports mild lower abdominal pain and pressure.  Contractions: Not present. Vag. Bleeding: None.  Movement: Present. Denies leaking of fluid.   The following portions of the patient's history were reviewed and updated as appropriate: allergies, current medications, past family history, past medical history, past social history, past surgical history and problem list. Problem list updated.  Objective:   Filed Vitals:   05/11/15 1244  BP: 124/72  Pulse: 55  Temp: 98.4 F (36.9 C)  Weight: 170 lb 8 oz (77.338 kg)    Fetal Status: Fetal Heart Rate (bpm): 143   Movement: Present     General:  Alert, oriented and cooperative. Patient is in no acute distress.  Skin: Skin is warm and dry. No rash noted.   Cardiovascular: Normal heart rate noted  Respiratory: Normal respiratory effort, no problems with respiration noted  Abdomen: Soft, gravid, appropriate for gestational age. Pain/Pressure: Present     Pelvic: Vag. Bleeding: None Vag D/C Character: White   Cervical exam deferred        Extremities: Normal range of motion.  Edema: Trace  Mental Status: Normal mood and affect. Normal behavior. Normal judgment and thought content.   Urinalysis:      Assessment and Plan:  Pregnancy: G1P0 at [redacted]w[redacted]d  1. Low amniotic fluid, third trimester, fetus 1 Resolved: F/U AFI 12.88 on 04/27/15, growth 41st %ile  2. 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 Please refer to After Visit Summary for other counseling recommendations.  Return in about 1 week (around 05/18/2015).   Lorene Dy, CNM

## 2015-05-11 NOTE — Progress Notes (Signed)
Edema- feet  Pain/pressure- lower abd  Educated pt on [place]

## 2015-05-11 NOTE — Patient Instructions (Signed)

## 2015-05-17 ENCOUNTER — Telehealth (HOSPITAL_COMMUNITY): Payer: Self-pay | Admitting: Obstetrics & Gynecology

## 2015-05-17 NOTE — Telephone Encounter (Signed)
Patient called to cancel her upcoming c/section.  She has now relocated to [facility] and plans to seek care there and deliver there. Cancelled case with OR scheduling.

## 2015-05-18 ENCOUNTER — Encounter: Payer: Self-pay | Admitting: Family Medicine

## 2015-05-26 ENCOUNTER — Encounter (HOSPITAL_COMMUNITY): Admission: RE | Payer: Self-pay | Source: Ambulatory Visit

## 2015-05-26 ENCOUNTER — Inpatient Hospital Stay (HOSPITAL_COMMUNITY): Admission: RE | Admit: 2015-05-26 | Payer: Self-pay | Source: Ambulatory Visit | Admitting: Obstetrics & Gynecology

## 2015-05-26 SURGERY — Surgical Case
Anesthesia: Regional | Site: Abdomen

## 2016-02-02 ENCOUNTER — Encounter (HOSPITAL_COMMUNITY): Payer: Self-pay
